# Patient Record
Sex: Female | Born: 1975 | Race: White | Hispanic: Yes | Marital: Single | Smoking: Never smoker
Health system: Southern US, Community
[De-identification: ages and names within clinical notes are randomized; demographics above are authoritative.]

---

## 1998-08-19 ENCOUNTER — Ambulatory Visit (HOSPITAL_COMMUNITY): Admission: RE | Admit: 1998-08-19 | Discharge: 1998-08-19 | Payer: Self-pay | Admitting: Family Medicine

## 1998-08-19 ENCOUNTER — Emergency Department (HOSPITAL_COMMUNITY): Admission: EM | Admit: 1998-08-19 | Discharge: 1998-08-19 | Payer: Self-pay | Admitting: Emergency Medicine

## 1998-08-19 ENCOUNTER — Encounter: Payer: Self-pay | Admitting: Family Medicine

## 1998-08-20 ENCOUNTER — Encounter: Payer: Self-pay | Admitting: Emergency Medicine

## 2001-03-15 ENCOUNTER — Encounter: Admission: RE | Admit: 2001-03-15 | Discharge: 2001-03-15 | Payer: Self-pay | Admitting: Family Medicine

## 2001-03-19 ENCOUNTER — Inpatient Hospital Stay (HOSPITAL_COMMUNITY): Admission: AD | Admit: 2001-03-19 | Discharge: 2001-03-19 | Payer: Self-pay | Admitting: *Deleted

## 2001-03-19 ENCOUNTER — Encounter: Payer: Self-pay | Admitting: *Deleted

## 2001-03-22 ENCOUNTER — Encounter: Admission: RE | Admit: 2001-03-22 | Discharge: 2001-03-22 | Payer: Self-pay | Admitting: Family Medicine

## 2001-03-25 ENCOUNTER — Encounter: Payer: Self-pay | Admitting: Obstetrics

## 2001-03-25 ENCOUNTER — Inpatient Hospital Stay (HOSPITAL_COMMUNITY): Admission: AD | Admit: 2001-03-25 | Discharge: 2001-03-25 | Payer: Self-pay | Admitting: Obstetrics

## 2001-03-27 ENCOUNTER — Encounter: Admission: RE | Admit: 2001-03-27 | Discharge: 2001-03-27 | Payer: Self-pay | Admitting: Family Medicine

## 2001-04-04 ENCOUNTER — Encounter: Admission: RE | Admit: 2001-04-04 | Discharge: 2001-04-04 | Payer: Self-pay | Admitting: Family Medicine

## 2001-04-19 ENCOUNTER — Encounter: Admission: RE | Admit: 2001-04-19 | Discharge: 2001-04-19 | Payer: Self-pay | Admitting: Family Medicine

## 2001-05-02 ENCOUNTER — Encounter: Admission: RE | Admit: 2001-05-02 | Discharge: 2001-05-02 | Payer: Self-pay | Admitting: Sports Medicine

## 2001-05-02 ENCOUNTER — Encounter (INDEPENDENT_AMBULATORY_CARE_PROVIDER_SITE_OTHER): Payer: Self-pay | Admitting: Specialist

## 2001-05-02 ENCOUNTER — Inpatient Hospital Stay (HOSPITAL_COMMUNITY): Admission: AD | Admit: 2001-05-02 | Discharge: 2001-05-28 | Payer: Self-pay | Admitting: *Deleted

## 2001-05-03 ENCOUNTER — Encounter: Payer: Self-pay | Admitting: *Deleted

## 2001-05-08 ENCOUNTER — Encounter: Payer: Self-pay | Admitting: Obstetrics

## 2001-05-10 ENCOUNTER — Encounter: Admission: RE | Admit: 2001-05-10 | Discharge: 2001-05-10 | Payer: Self-pay | Admitting: Family Medicine

## 2001-05-13 ENCOUNTER — Encounter: Payer: Self-pay | Admitting: Family Medicine

## 2001-05-26 ENCOUNTER — Encounter: Payer: Self-pay | Admitting: *Deleted

## 2003-08-27 ENCOUNTER — Ambulatory Visit (HOSPITAL_COMMUNITY): Admission: RE | Admit: 2003-08-27 | Discharge: 2003-08-27 | Payer: Self-pay | Admitting: Obstetrics and Gynecology

## 2003-09-17 ENCOUNTER — Ambulatory Visit (HOSPITAL_COMMUNITY): Admission: RE | Admit: 2003-09-17 | Discharge: 2003-09-17 | Payer: Self-pay | Admitting: Family Medicine

## 2009-11-10 ENCOUNTER — Encounter: Admission: RE | Admit: 2009-11-10 | Discharge: 2009-11-10 | Payer: Self-pay | Admitting: Family Medicine

## 2010-09-19 ENCOUNTER — Encounter: Payer: Self-pay | Admitting: Family Medicine

## 2011-01-15 NOTE — Discharge Summary (Signed)
[facility]  Patient:    Makayla Guerrero, Makayla Guerrero Visit Number: [id number] MRN: [id number]          Service Type: OBS Location: 9300 [id number] Attending Physician:  Michaelle Copas Dictated by:   Dalbert Mayotte, M.D. Admit Date:  05/02/2001 Discharge Date: 05/28/2001                             Discharge Summary  DATE OF BIRTH:                Jun 18, 1976  DISCHARGE DIAGNOSES:           1. Normal spontaneous vaginal delivery of twin                                  males, both footling breech.                               2. Preterm rupture of membranes.                               3. Oligohydramnios.                               4. Positive group B strep status.  DISCHARGE MEDICATIONS:        1. Motrin 600 mg p.o. q.6h. p.r.n. pain.                               2. Depo-Provera 150 mg IM every three months.  PROCEDURES:                   1. Breech extraction of two female twins.                               2. Peripherally inserted central catheter line                                  placement.                               3. Administration of IV antibiotics postpartum                                  and prenatal.  HISTORY OF PRESENT ILLNESS:   The patient is a 35 year old G3, P2-0-0-2 who presented at 24-3/7 weeks by 23-week ultrasound, who was found to have a cervical exam of fingertip and only 0.5 cm in length.  she also was noted to be GBS positive and to be a twin gestation.  Secondary to her cervical change and GBS positive status, she was brought in for observation.  HOSPITAL COURSE:              The patient was admitted, placed on IV antibiotics and placed on continuous monitoring to evaluate for any further contractions.  The patient had an ultrasound for  AFI and was found to have an oligo around twin A.  Twin A had 3.0 Doppler for more than normal limits. Twin B had 5.0 with Dopplers 4-8, which were elevated.  The  patient was evaluated over this time, during which time the heart rate tracings were reassuring on both twin A and B.  Repeat AFI was performed.  As the pregnancy developed past 27-4/7 weeks, steroids were administered for improved fetal lung maturity.  I was decided to plan for C-section delivery with cervical change.  The patient did have some cervical change take place on the morning of September 28.  It was fingertip, 1 cm, soft and 50% to 60% effaced.  It was felt that she was probably in labor.  She was continued on close monitoring. On the morning of May 27, 2001, the patient was found in the bathroom and had had precipitous delivery of Baby A via footling breech.  At 0705, a female was delivered with Apgars of 4 at one minute and 7 at five minutes.  The patient was immediately taken back to the OR for delivery of twin B but, on arrival to the OR, had spontaneous rupture of membranes and twin B was delivered vaginally via breech delivery at [facility].  Apgars on twin B were 7 at one minute and 8 at five minutes.  Both infants were taken to the NICU.  The placenta was retained, and this was manually extracted.  There were no lacerations identified.  Postpartum, the patient remained stable.  She was continued on IV antibiotics for an additional 24 hours.  She remained afebrile.  Postpartum hemoglobin on the date of discharge was 11.1.  She was without complaints, ambulating well, tolerating p.o. well with minimal pain. Her bleeding was minimal, as well.  She did request to go home on the date of discharge.  She did receive a Depo-Provera injection prior to discharge per her request.  She was felt stable for discharge to home on May 28, 2001.  DISCHARGE INSTRUCTIONS:       She was instructed to use the Motrin 600 mg q.6h. p.r.n. pain.  Increase activity as tolerated.  Avoid heavy lifting for the next two weeks.  She was instructed to follow up at [facility] in six weeks.  She  was instructed that, if she should develop a fever or worsening pain that is not relieved with the Motrin, she should call [facility] or immediately return to the maternity admissions unit.Dictated by:   Dalbert Mayotte, M.D. Attending Physician:  Michaelle Copas DD:  05/28/01 TD:  05/28/01 Job: [phone] [id number]

## 2011-01-15 NOTE — Op Note (Signed)
[facility]  Patient:    Makayla Guerrero, Makayla Guerrero Visit Number: [id number] MRN: [id number]          Service Type: OBS Location: 910D 9125 01 Attending Physician:  Michaelle Copas Dictated by:   Bing Neighbors Clearance Coots, M.D. Proc. Date: 05/27/01 Admit Date:  05/02/2001                             Operative Report  OBSTETRICIAN:                 Charles A. Clearance Coots, M.D.  DELIVERY NOTE:  I was called to the patients room for delivery of infant that had precipitously delivered in the bathroom when the patient got up to go to the bathroom while waiting to be transported to the operating room for cesarean section delivery. Baby A was a normal spontaneous delivery precipitously from breech, a viable female at 66.  Apgars were 4 at one minute and 7 at five minutes.  Weight was 1163 g.  Cord pH was 7.18.  On examination after delivery of baby A, the membranes were bulging and the cervix was fully dilated.  The decision was made to transport the patient to the operating room for further examination for the presenting part and delivery plan for twin B. The cervix was fully dilated.  Upon arrival in the operating room, the patients membranes ruptured spontaneously on the operating table and the breech presented at 0 station in the vagina.  The decision was made to proceed with vaginal delivery.  Baby B was a normal spontaneous vaginal delivery from frank breech of a viable female at 58.  Apgars were 7 at one minute and 8 at five minutes.  Weight was 1200 g.  Cord pH was 7.27.  The placenta was retained and therefore manually extracted intact.  The cervix and vagina were inspected for lacerations and there were no lacerations noted.  Estimated blood loss was 400 ml.  The patient tolerated the procedure well.  Both infants were taken to the neonatal intensive care unit because of prematurity. ictated by:   Bing Neighbors Clearance Coots, M.D. Attending Physician:  Michaelle Copas DD:  05/27/01 TD:  05/27/01 Job: [phone] [id number]

## 2014-06-10 ENCOUNTER — Encounter (HOSPITAL_BASED_OUTPATIENT_CLINIC_OR_DEPARTMENT_OTHER): Payer: Self-pay | Admitting: Emergency Medicine

## 2014-06-10 ENCOUNTER — Emergency Department (HOSPITAL_BASED_OUTPATIENT_CLINIC_OR_DEPARTMENT_OTHER): Payer: Worker's Compensation

## 2014-06-10 ENCOUNTER — Emergency Department (HOSPITAL_BASED_OUTPATIENT_CLINIC_OR_DEPARTMENT_OTHER)
Admission: EM | Admit: 2014-06-10 | Discharge: 2014-06-10 | Disposition: A | Discharge status: Home / Self Care | Payer: Worker's Compensation | Attending: Emergency Medicine | Admitting: Emergency Medicine

## 2014-06-10 DIAGNOSIS — S62639B Displaced fracture of distal phalanx of unspecified finger, initial encounter for open fracture: Secondary | ICD-10-CM

## 2014-06-10 DIAGNOSIS — Y990 Civilian activity done for income or pay: Secondary | ICD-10-CM | POA: Diagnosis not present

## 2014-06-10 DIAGNOSIS — Y9389 Activity, other specified: Secondary | ICD-10-CM | POA: Diagnosis not present

## 2014-06-10 DIAGNOSIS — W230XXA Caught, crushed, jammed, or pinched between moving objects, initial encounter: Secondary | ICD-10-CM | POA: Diagnosis not present

## 2014-06-10 DIAGNOSIS — Z23 Encounter for immunization: Secondary | ICD-10-CM | POA: Diagnosis not present

## 2014-06-10 DIAGNOSIS — S62630B Displaced fracture of distal phalanx of right index finger, initial encounter for open fracture: Secondary | ICD-10-CM | POA: Insufficient documentation

## 2014-06-10 DIAGNOSIS — S68129A Partial traumatic metacarpophalangeal amputation of unspecified finger, initial encounter: Secondary | ICD-10-CM

## 2014-06-10 DIAGNOSIS — Y9289 Other specified places as the place of occurrence of the external cause: Secondary | ICD-10-CM | POA: Diagnosis not present

## 2014-06-10 DIAGNOSIS — S6991XA Unspecified injury of right wrist, hand and finger(s), initial encounter: Secondary | ICD-10-CM | POA: Diagnosis present

## 2014-06-10 DIAGNOSIS — S68119A Complete traumatic metacarpophalangeal amputation of unspecified finger, initial encounter: Secondary | ICD-10-CM

## 2014-06-10 MED ORDER — CEPHALEXIN 500 MG PO CAPS: 500.0000 mg | ORAL_CAPSULE | Freq: Four times a day (QID) | ORAL | Status: DC

## 2014-06-10 MED ORDER — MORPHINE SULFATE 4 MG/ML IJ SOLN
4.0000 mg | Freq: Once | INTRAMUSCULAR | Status: AC
  Administered 2014-06-10: 4 mg via INTRAVENOUS
  Filled 2014-06-10: qty 1

## 2014-06-10 MED ORDER — OXYCODONE-ACETAMINOPHEN 5-325 MG PO TABS: 1.0000 | ORAL_TABLET | ORAL | Status: DC | PRN

## 2014-06-10 MED ORDER — ONDANSETRON HCL 4 MG/2ML IJ SOLN
4.0000 mg | Freq: Once | INTRAMUSCULAR | Status: AC
  Administered 2014-06-10: 4 mg via INTRAVENOUS
  Filled 2014-06-10: qty 2

## 2014-06-10 MED ORDER — SODIUM CHLORIDE 0.9 % IV SOLN
Freq: Once | INTRAVENOUS | Status: AC
  Administered 2014-06-10: 16:00:00 via INTRAVENOUS

## 2014-06-10 MED ORDER — CEFAZOLIN SODIUM 1-5 GM-% IV SOLN
1.0000 g | Freq: Once | INTRAVENOUS | Status: AC
  Administered 2014-06-10: 1 g via INTRAVENOUS
  Filled 2014-06-10: qty 50

## 2014-06-10 MED ORDER — TETANUS-DIPHTH-ACELL PERTUSSIS 5-2.5-18.5 LF-MCG/0.5 IM SUSP
0.5000 mL | Freq: Once | INTRAMUSCULAR | Status: AC
  Administered 2014-06-10: 0.5 mL via INTRAMUSCULAR
  Filled 2014-06-10: qty 0.5

## 2014-06-10 MED ORDER — TETANUS-DIPHTHERIA TOXOIDS TD 5-2 LFU IM INJ: 0.5000 mL | INJECTION | Freq: Once | INTRAMUSCULAR | Status: DC

## 2014-06-10 MED ORDER — OXYCODONE-ACETAMINOPHEN 5-325 MG PO TABS
ORAL_TABLET | ORAL | Status: AC
  Administered 2014-06-10: 2
  Filled 2014-06-10: qty 2

## 2014-06-10 NOTE — Consult Note (Signed)
ORTHOPAEDIC CONSULTATION HISTORY & PHYSICAL REQUESTING PHYSICIAN: Charlesetta Shanks, MD  Chief Complaint: Right ring finger injury  HPI: Makayla Guerrero is a 38 y.o. female who sustained a terminal amputation of the right ring finger today. This is reported to be on the job. Amputated portions of the finger and present. Apparently some type of machine fell upon her ring fingertip, pinching it off. She has had her tetanus updated. Her finger has a dressing on it.  History reviewed. No pertinent past medical history. History reviewed. No pertinent past surgical history. History   Social History  . Marital Status: Married    Spouse Name: N/A    Number of Children: N/A  . Years of Education: N/A   Social History Main Topics  . Smoking status: Never Smoker   . Smokeless tobacco: None  . Alcohol Use: No  . Drug Use: No  . Sexual Activity: None   Other Topics Concern  . None   Social History Narrative  . None   No family history on file. No Known Allergies Prior to Admission medications   Medication Sig Start Date End Date Taking? Authorizing Provider  cephALEXin (KEFLEX) 500 MG capsule Take 1 capsule (500 mg total) by mouth 4 (four) times daily. 06/10/14   Jolyn Nap, MD  oxyCODONE-acetaminophen (ROXICET) 5-325 MG per tablet Take 1-2 tablets by mouth every 4 (four) hours as needed for severe pain. 06/10/14   Jolyn Nap, MD   Dg Hand Complete Right  06/10/2014   CLINICAL DATA:  38 year old female status post crush injury at work to the right ring finger (not otherwise specified at the time of this report).  EXAM: RIGHT HAND - COMPLETE 3+ VIEW  COMPARISON:  None.  FINDINGS: Traumatic amputation of a portion of the distal soft tissues at the right index finger. Underlying comminuted fracture of the tuft, with displaced fragments, including approximating the skin surface. The distal aspect of the residual fourth proximal phalanx also approximates the skin surface. The  fourth DIP appears intact. Other osseous structures appear intact.  IMPRESSION: Posttraumatic open, comminuted fracture of the tuft of the right fourth distal phalanx with some overlying soft tissue amputation.   Electronically Signed   By: Lars Pinks M.D.   On: 06/10/2014 14:55    Positive ROS: All other systems have been reviewed and were otherwise negative with the exception of those mentioned in the HPI and as above.  Physical Exam: Vitals: Refer to EMR. Constitutional:  WD, WN, NAD HEENT:  NCAT, EOMI Neuro/Psych:  Alert & oriented to person, place, and time; appropriate mood & affect Lymphatic: No generalized extremity edema or lymphadenopathy Extremities / MSK:  The extremities are normal with respect to appearance, ranges of motion, joint stability, muscle strength/tone, sensation, & perfusion except as otherwise noted:  Right ring fingertip is missing. The amputation is slightly oblique, shorter on the ulnar side longer on radial side. All of the terminal matrix is intact, and a portion of the base of the sterile matrix is intact, although amputated obliquely. The nail bed there remains does have good distal phalangeal support. There are a couple of other smaller pieces of bone within the soft tissues, and these were excised. There is a non-volar soft tissue to repair to the distal edge of the nail bed with suture.  Assessment: Right ring fingertip amputation through the distal phalanx  Plan: Digital block was instilled by me with plain lidocaine. Tourniquet was applied the base of the digit and the  digit wound was copiously irrigated under running water at the sink. Further inspected it under loupe magnification with bright lights, with findings as described above. I conversed with the patient, translated via bilingual people within the room, and the patient wished to try to preserve all length as possible at this time. Consequently, the small free fragments of bone were excised. The wound  edges, particularly volar radially was debrided sharply, with excisional debridement of skin and subcutaneous tissues. The subcutaneous soft tissues of the volar pad that remained were then repaired to cover the end of the distal phalanx that remained, and this was done with 5-0 Vicryl Rapide interrupted sutures. A dressing was applied with Xeroform, gauze, and ultimately Coban.  She was given prescriptions for pain medicines, antibiotics, and discharge/followup instructions. RTC next week Monday or Tuesday for reevaluation of the wound.  Rayvon Char Grandville Silos, [address] [place], [place]  [address] Office: [phone] Mobile: [phone]

## 2014-06-10 NOTE — ED Notes (Signed)
Mash injury to her right 4th digit at work today. Bleeding controlled.

## 2014-06-10 NOTE — Discharge Instructions (Signed)
Keep your bandage on, clean and dry.     WORK STATUS:  No work until re-eval appointment next week

## 2014-06-10 NOTE — ED Provider Notes (Signed)
CSN: [id number]     Arrival date & time 06/10/14  1412 History   First MD Initiated Contact with Patient 06/10/14 1505     Chief Complaint  Patient presents with  . Hand Injury     (Consider location/radiation/quality/duration/timing/severity/associated sxs/prior Treatment) HPI Patient caught her right fourth digit in a machine at work. The tip was crushed. She reports is very painful. There is no other associated injury.  History reviewed. No pertinent past medical history. History reviewed. No pertinent past surgical history. No family history on file. History  Substance Use Topics  . Smoking status: Never Smoker   . Smokeless tobacco: Not on file  . Alcohol Use: No   OB History   Grav Para Term Preterm Abortions TAB SAB Ect Mult Living                 Review of Systems  Constitutional: No recent fever chills or other general illness.  Allergies  Review of patient's allergies indicates no known allergies.  Home Medications   Prior to Admission medications   Medication Sig Start Date End Date Taking? Authorizing Provider  cephALEXin (KEFLEX) 500 MG capsule Take 1 capsule (500 mg total) by mouth 4 (four) times daily. 06/10/14   Jolyn Nap, MD  oxyCODONE-acetaminophen (ROXICET) 5-325 MG per tablet Take 1-2 tablets by mouth every 4 (four) hours as needed for severe pain. 06/10/14   Jolyn Nap, MD   BP 125/80  Pulse 77  Temp(Src) 98.4 F (36.9 C) (Oral)  Resp 16  Ht 5\' 6"  (1.676 m)  Wt 186 lb (84.369 kg)  BMI 30.04 kg/m2  SpO2 97%  LMP 05/27/2014 Physical Exam  Constitutional: She is oriented to person, place, and time. She appears well-developed and well-nourished.  The patient is in pain, but alert appropriate and interactive.  HENT:  Head: Normocephalic and atraumatic.  Eyes: EOM are normal.  Cardiovascular: Normal rate, regular rhythm and normal heart sounds.   Pulmonary/Chest: Effort normal and breath sounds normal.  Musculoskeletal:  The  right fourth digit has a crush avulsion to the distal portion. The nail is completely of avulsed and unseated, there is no apparent eponychial fold for reinsertion. Most of the distal pad is avulsed as well as the tip, the bone is centrally visible.  Neurological: She is alert and oriented to person, place, and time. Coordination normal.  Skin: Skin is warm and dry.  Psychiatric: She has a normal mood and affect.     ED Course  Procedures (including critical care time) Labs Review Labs Reviewed - No data to display  Imaging Review Dg Hand Complete Right  06/10/2014   CLINICAL DATA:  38 year old female status post crush injury at work to the right ring finger (not otherwise specified at the time of this report).  EXAM: RIGHT HAND - COMPLETE 3+ VIEW  COMPARISON:  None.  FINDINGS: Traumatic amputation of a portion of the distal soft tissues at the right index finger. Underlying comminuted fracture of the tuft, with displaced fragments, including approximating the skin surface. The distal aspect of the residual fourth proximal phalanx also approximates the skin surface. The fourth DIP appears intact. Other osseous structures appear intact.  IMPRESSION: Posttraumatic open, comminuted fracture of the tuft of the right fourth distal phalanx with some overlying soft tissue amputation.   Electronically Signed   By: Lars Pinks M.D.   On: 06/10/2014 14:55     EKG Interpretation None     Consult:16:05. Hand specialist, Dr. Grandville Silos,  will see in ED. MDM   Final diagnoses:  Amputation of finger tip, initial encounter  Open fracture of tuft of distal phalanx of finger, initial encounter   Complex repair has been done by Dr. Grandville Silos in the emergency department. A followup plan is in place.    Charlesetta Shanks, MD 06/10/14 [id number]

## 2014-06-10 NOTE — ED Notes (Signed)
Pfeiffer MD at bedside 

## 2018-12-23 ENCOUNTER — Other Ambulatory Visit: Payer: Self-pay

## 2018-12-23 ENCOUNTER — Encounter (HOSPITAL_COMMUNITY): Payer: Self-pay | Admitting: Emergency Medicine

## 2018-12-23 ENCOUNTER — Emergency Department (HOSPITAL_COMMUNITY): Payer: Commercial Managed Care - PPO

## 2018-12-23 ENCOUNTER — Emergency Department (HOSPITAL_COMMUNITY)
Admission: EM | Admit: 2018-12-23 | Discharge: 2018-12-23 | Disposition: A | Discharge status: Home / Self Care | Payer: Commercial Managed Care - PPO | Attending: Emergency Medicine | Admitting: Emergency Medicine

## 2018-12-23 DIAGNOSIS — S63633A Sprain of interphalangeal joint of left middle finger, initial encounter: Secondary | ICD-10-CM | POA: Diagnosis not present

## 2018-12-23 DIAGNOSIS — Y990 Civilian activity done for income or pay: Secondary | ICD-10-CM | POA: Insufficient documentation

## 2018-12-23 DIAGNOSIS — Y9389 Activity, other specified: Secondary | ICD-10-CM | POA: Insufficient documentation

## 2018-12-23 DIAGNOSIS — Y9289 Other specified places as the place of occurrence of the external cause: Secondary | ICD-10-CM | POA: Diagnosis not present

## 2018-12-23 DIAGNOSIS — X500XXA Overexertion from strenuous movement or load, initial encounter: Secondary | ICD-10-CM | POA: Insufficient documentation

## 2018-12-23 DIAGNOSIS — S6991XA Unspecified injury of right wrist, hand and finger(s), initial encounter: Secondary | ICD-10-CM | POA: Diagnosis present

## 2018-12-23 MED ORDER — MELOXICAM 15 MG PO TABS: 15.0000 mg | ORAL_TABLET | Freq: Every day | ORAL | 0 refills | Status: AC

## 2018-12-23 NOTE — Discharge Instructions (Addendum)
Get help right away if: Your pain is not controlled with medicine. Your bruising or swelling gets worse. Your splint or cast is damaged. Your finger is numb or blue. Your finger feels colder to the touch than normal. You develop a fever.

## 2018-12-23 NOTE — ED Triage Notes (Signed)
Pt hurt her finger yesterday at work. Pt left middle finger is noticeably swollen and red. Pt has taken tylenol for the pain.

## 2018-12-23 NOTE — ED Notes (Signed)
Patient verbalizes understanding of discharge instructions. Opportunity for questioning and answers were provided. Armband removed by staff, pt discharged from ED ambulatory to home.  

## 2018-12-23 NOTE — ED Provider Notes (Signed)
[facility] EMERGENCY DEPARTMENT Provider Note   CSN: [id number] Arrival date & time: 12/23/18  1512    History   Chief Complaint No chief complaint on file.   HPI Makayla Guerrero is a 43 y.o. female.     The history is limited by a language barrier. A language interpreter was used.  Hand Pain  This is a new problem. The current episode started yesterday. The problem has not changed since onset.Pertinent negatives include no chest pain, no abdominal pain, no headaches and no shortness of breath. The symptoms are aggravated by bending. Nothing relieves the symptoms.   Makayla Guerrero is a 43 y.o. female who sustained a left middle finger injury 1 day(s) ago. Mechanism of injury: Patient was at work and had a rapid extension of the left middle digit. Immediate symptoms: immediate pain, immediate swelling, no deformity was noted by the patient. Symptoms have been constant since that time. Prior history of related problems: no prior problems with this area in the past. Pain is worst at the PIP joint., She has bruising and swelling. Pain is throbbing and constant. Worsened with movement at the PIP. + associated paresthesia. Noweakness  .     History reviewed. No pertinent past medical history.  There are no active problems to display for this patient.   History reviewed. No pertinent surgical history.   OB History   No obstetric history on file.      Home Medications    Prior to Admission medications   Medication Sig Start Date End Date Taking? Authorizing Provider  acetaminophen (TYLENOL) 500 MG tablet Take 1,000 mg by mouth every 6 (six) hours as needed for mild pain or fever.   Yes [provider]  cephALEXin (KEFLEX) 500 MG capsule Take 1 capsule (500 mg total) by mouth 4 (four) times daily. Patient not taking: Reported on 12/23/2018 06/10/14   Milly Jakob, MD  meloxicam (MOBIC) 15 MG tablet Take 1 tablet (15 mg total) by mouth  daily. Take 1 daily with food. 12/23/18   Margarita Mail, PA-C  oxyCODONE-acetaminophen (ROXICET) 5-325 MG per tablet Take 1-2 tablets by mouth every 4 (four) hours as needed for severe pain. Patient not taking: Reported on 12/23/2018 06/10/14   Milly Jakob, MD    Family History No family history on file.  Social History Social History   Tobacco Use  . Smoking status: Never Smoker  Substance Use Topics  . Alcohol use: No  . Drug use: No     Allergies   Patient has no known allergies.   Review of Systems Review of Systems  Respiratory: Negative for shortness of breath.   Cardiovascular: Negative for chest pain.  Gastrointestinal: Negative for abdominal pain.  Neurological: Negative for headaches.     Physical Exam Updated Vital Signs BP 119/83   Pulse (!) 58   Temp 98.5 F (36.9 C) (Oral)   Resp 16   SpO2 99%   Physical Exam Vitals signs and nursing note reviewed.  Constitutional:      General: She is not in acute distress.    Appearance: She is well-developed. She is not diaphoretic.  HENT:     Head: Normocephalic and atraumatic.  Eyes:     General: No scleral icterus.    Conjunctiva/sclera: Conjunctivae normal.  Neck:     Musculoskeletal: Normal range of motion.  Cardiovascular:     Rate and Rhythm: Normal rate and regular rhythm.     Heart sounds: Normal heart  sounds. No murmur. No friction rub. No gallop.   Pulmonary:     Effort: Pulmonary effort is normal. No respiratory distress.     Breath sounds: Normal breath sounds.  Abdominal:     General: Bowel sounds are normal. There is no distension.     Palpations: Abdomen is soft. There is no mass.     Tenderness: There is no abdominal tenderness. There is no guarding.  Musculoskeletal:     Comments: Hand exam: soft tissue tenderness and swelling at the PIP and along the collateral ligament on the radial side of the finger, reduced range of motion of at the PIP with swellng and ecchymosis ROM/  strength WNL at DIP/MCP, radial pulse normal, remainder of ipsilateral wrist, hand and finger exam is normal.   Skin:    General: Skin is warm and dry.  Neurological:     Mental Status: She is alert and oriented to person, place, and time.  Psychiatric:        Behavior: Behavior normal.      ED Treatments / Results  Labs (all labs ordered are listed, but only abnormal results are displayed) Labs Reviewed - No data to display  EKG None  Radiology Dg Finger Middle Left  Result Date: 12/23/2018 CLINICAL DATA:  Finger injury swelling and redness EXAM: LEFT MIDDLE FINGER 2+V COMPARISON:  None. FINDINGS: There is no evidence of fracture or dislocation. There is no evidence of arthropathy or other focal bone abnormality. Soft tissue swelling is present IMPRESSION: No acute osseous abnormality Electronically Signed   By: Donavan Foil M.D.   On: 12/23/2018 17:58    Procedures Procedures (including critical care time)  Medications Ordered in ED Medications - No data to display   Initial Impression / Assessment and Plan / ED Course  I have reviewed the triage vital signs and the nursing notes.  Pertinent labs & imaging results that were available during my care of the patient were reviewed by me and considered in my medical decision making (see chart for details).       Patient X-Ray negative for obvious fracture or dislocation.  Personally reviewed the left middle finger x-ray.  Agree with radiologic interpretation. . Pt advised to follow up with orthopedics if symptoms persist for possibility of missed fracture diagnosis. Patient given brace while in ED, conservative therapy recommended and discussed. Patient will be dc home & is agreeable with above plan.   Final Clinical Impressions(s) / ED Diagnoses   Final diagnoses:  Sprain of interphalangeal joint of left middle finger, initial encounter    ED Discharge Orders         Ordered    meloxicam (MOBIC) 15 MG tablet  Daily      12/23/18 1815           Margarita Mail, PA-C 12/23/18 1817    Daleen Bo, MD 12/25/18 [phone]

## 2019-01-24 ENCOUNTER — Other Ambulatory Visit: Payer: Self-pay

## 2019-01-24 ENCOUNTER — Ambulatory Visit
Admission: EM | Admit: 2019-01-24 | Discharge: 2019-01-24 | Disposition: A | Discharge status: Home / Self Care | Payer: Commercial Managed Care - PPO | Attending: Family Medicine | Admitting: Family Medicine

## 2019-01-24 DIAGNOSIS — Z202 Contact with and (suspected) exposure to infections with a predominantly sexual mode of transmission: Secondary | ICD-10-CM | POA: Diagnosis present

## 2019-01-24 MED ORDER — LIDOCAINE HCL (PF) 1 % IJ SOLN
2.0000 mL | Freq: Once | INTRAMUSCULAR | Status: AC
  Administered 2019-01-24: 2 mL

## 2019-01-24 MED ORDER — CEFTRIAXONE SODIUM 250 MG IJ SOLR
250.0000 mg | Freq: Once | INTRAMUSCULAR | Status: AC
  Administered 2019-01-24: 250 mg via INTRAMUSCULAR

## 2019-01-24 MED ORDER — AZITHROMYCIN 500 MG PO TABS
1000.0000 mg | ORAL_TABLET | Freq: Once | ORAL | Status: AC
  Administered 2019-01-24: 1000 mg via ORAL

## 2019-01-24 NOTE — ED Provider Notes (Signed)
[name]    CSN: [id number] Arrival date & time: 01/24/19  1450     History   Chief Complaint Chief Complaint  Patient presents with  . SEXUALLY TRANSMITTED DISEASE    HPI Makayla Guerrero is a 43 y.o. female.   Pt is a 43 year old female that presents for STD screening. She was told by partner of year and half that he was positive for STD. She is unsure of which STD. He did not tell her. She has been having unprotected sex with him. She denies any dysuria, hematuria, urinary frequency, vaginal discharge, itching or irritation.  Denies any rashes.  Denies any fever, abdominal pain, back pain or pelvic pain.  ROS per HPI  All information was obtained using the Spanish interpreter      History reviewed. No pertinent past medical history.  There are no active problems to display for this patient.   History reviewed. No pertinent surgical history.  OB History   No obstetric history on file.      Home Medications    Prior to Admission medications   Medication Sig Start Date End Date Taking? Authorizing Provider  acetaminophen (TYLENOL) 500 MG tablet Take 1,000 mg by mouth every 6 (six) hours as needed for mild pain or fever.    [provider]  meloxicam (MOBIC) 15 MG tablet Take 1 tablet (15 mg total) by mouth daily. Take 1 daily with food. 12/23/18   Margarita Mail, PA-C    Family History No family history on file.  Social History Social History   Tobacco Use  . Smoking status: Never Smoker  . Smokeless tobacco: Never Used  Substance Use Topics  . Alcohol use: No  . Drug use: No     Allergies   Patient has no known allergies.   Review of Systems Review of Systems   Physical Exam Triage Vital Signs ED Triage Vitals  Enc Vitals Group     BP 01/24/19 1503 111/68     Pulse Rate 01/24/19 1503 77     Resp 01/24/19 1503 18     Temp 01/24/19 1503 98.1 F (36.7 C)     Temp Source 01/24/19 1503 Oral     SpO2 01/24/19  1503 96 %     Weight --      Height --      Head Circumference --      Peak Flow --      Pain Score 01/24/19 1504 0     Pain Loc --      Pain Edu? --      Excl. in [place]? --    No data found.  Updated Vital Signs BP 111/68 (BP Location: Left Arm)   Pulse 77   Temp 98.1 F (36.7 C) (Oral)   Resp 18   LMP 01/17/2019   SpO2 96%   Visual Acuity Right Eye Distance:   Left Eye Distance:   Bilateral Distance:    Right Eye Near:   Left Eye Near:    Bilateral Near:     Physical Exam Vitals signs and nursing note reviewed.  Constitutional:      General: She is not in acute distress.    Appearance: Normal appearance. She is not ill-appearing, toxic-appearing or diaphoretic.  HENT:     Head: Normocephalic.     Nose: Nose normal.     Mouth/Throat:     Pharynx: Oropharynx is clear.  Eyes:     Conjunctiva/sclera: Conjunctivae normal.  Neck:     Musculoskeletal: Normal range of motion.  Pulmonary:     Effort: Pulmonary effort is normal.  Abdominal:     Palpations: Abdomen is soft.     Tenderness: There is no abdominal tenderness.  Musculoskeletal: Normal range of motion.  Skin:    General: Skin is warm and dry.     Findings: No rash.  Neurological:     Mental Status: She is alert.  Psychiatric:        Mood and Affect: Mood normal.      UC Treatments / Results  Labs (all labs ordered are listed, but only abnormal results are displayed) Labs Reviewed  HIV ANTIBODY (ROUTINE TESTING W REFLEX)  RPR  CERVICOVAGINAL ANCILLARY ONLY    EKG None  Radiology No results found.  Procedures Procedures (including critical care time)  Medications Ordered in UC Medications  cefTRIAXone (ROCEPHIN) injection 250 mg (250 mg Intramuscular Given 01/24/19 1530)  azithromycin (ZITHROMAX) tablet 1,000 mg (1,000 mg Oral Given 01/24/19 1530)  lidocaine (PF) (XYLOCAINE) 1 % injection 2 mL (2 mLs Other Given 01/24/19 1549)    Initial Impression / Assessment and Plan / UC Course  I  have reviewed the triage vital signs and the nursing notes.  Pertinent labs & imaging results that were available during my care of the patient were reviewed by me and considered in my medical decision making (see chart for details).    STD screening   Testing for STDs,  Treating prophylactically for gonorrhea and chlamydia. Labs pending  Final Clinical Impressions(s) / UC Diagnoses   Final diagnoses:  Exposure to STD     Discharge Instructions     We are treating you prophylactically today for gonorrhea and chlamydia We are sending your swab for testing and will call you with any positive results We are also testing you for HIV and syphilis We should have all of these results in 3 to 4 days.     ED Prescriptions    None     Controlled Substance Prescriptions [place] Controlled Substance Registry consulted? Not Applicable   Orvan July, NP 01/24/19 1711

## 2019-01-24 NOTE — ED Triage Notes (Signed)
Pt states through translator, "she had un protective intercourse with her partner for the past year and he states he has an illness, not sure if he got it from her or someone else. She wants to be checked for STD". Pt denies any symptoms

## 2019-01-24 NOTE — Discharge Instructions (Addendum)
We are treating you prophylactically today for gonorrhea and chlamydia We are sending your swab for testing and will call you with any positive results We are also testing you for HIV and syphilis We should have all of these results in 3 to 4 days.

## 2019-01-25 LAB — RPR: RPR Ser Ql: NONREACTIVE

## 2019-01-25 LAB — HIV ANTIBODY (ROUTINE TESTING W REFLEX): HIV Screen 4th Generation wRfx: NONREACTIVE

## 2019-01-26 LAB — CERVICOVAGINAL ANCILLARY ONLY
Chlamydia: NEGATIVE
Neisseria Gonorrhea: NEGATIVE
Trichomonas: NEGATIVE

## 2020-10-15 ENCOUNTER — Encounter: Payer: Self-pay | Admitting: Gastroenterology

## 2020-11-24 ENCOUNTER — Other Ambulatory Visit: Payer: Self-pay

## 2020-11-24 ENCOUNTER — Ambulatory Visit (AMBULATORY_SURGERY_CENTER): Payer: Self-pay | Admitting: *Deleted

## 2020-11-24 VITALS — Ht 66.0 in | Wt 188.0 lb

## 2020-11-24 DIAGNOSIS — Z1211 Encounter for screening for malignant neoplasm of colon: Secondary | ICD-10-CM

## 2020-11-24 MED ORDER — SUTAB 1479-225-188 MG PO TABS: 1.0000 | ORAL_TABLET | Freq: Once | ORAL | 0 refills | Status: AC

## 2020-11-24 NOTE — Progress Notes (Signed)

## 2020-11-24 NOTE — Progress Notes (Signed)
Interpreter used today at the [facility] for this pt.  Interpreter's name is- Derald Macleod

## 2020-12-03 ENCOUNTER — Encounter: Payer: Self-pay | Admitting: Gastroenterology

## 2020-12-08 ENCOUNTER — Other Ambulatory Visit: Payer: Self-pay

## 2020-12-08 ENCOUNTER — Ambulatory Visit (AMBULATORY_SURGERY_CENTER): Payer: Commercial Managed Care - PPO | Admitting: Gastroenterology

## 2020-12-08 ENCOUNTER — Encounter: Payer: Self-pay | Admitting: Gastroenterology

## 2020-12-08 VITALS — BP 98/61 | HR 56 | Temp 97.1°F | Resp 11 | Ht 66.0 in | Wt 188.0 lb

## 2020-12-08 DIAGNOSIS — K635 Polyp of colon: Secondary | ICD-10-CM | POA: Diagnosis not present

## 2020-12-08 DIAGNOSIS — D125 Benign neoplasm of sigmoid colon: Secondary | ICD-10-CM

## 2020-12-08 DIAGNOSIS — Z1211 Encounter for screening for malignant neoplasm of colon: Secondary | ICD-10-CM | POA: Diagnosis present

## 2020-12-08 DIAGNOSIS — D120 Benign neoplasm of cecum: Secondary | ICD-10-CM | POA: Diagnosis not present

## 2020-12-08 MED ORDER — SODIUM CHLORIDE 0.9 % IV SOLN
500.0000 mL | Freq: Once | INTRAVENOUS | Status: DC
Start: 2020-12-08 — End: 2020-12-08

## 2020-12-08 NOTE — Progress Notes (Signed)
Pt's states no medical or surgical changes since previsit or office visit. 

## 2020-12-08 NOTE — Progress Notes (Signed)
VS taken by C.W. 

## 2020-12-08 NOTE — Op Note (Signed)
[facility] Patient Name: Makayla Guerrero Procedure Date: 12/08/2020 8:14 AM MRN: [id number] Endoscopist: Gerrit Heck , MD Age: 45 Referring MD:  Date of Birth: [birth year] Gender: Female Account #: [id number] Procedure:                Colonoscopy Indications:              Screening for colorectal malignant neoplasm, This                            is the patient's first colonoscopy Medicines:                Monitored Anesthesia Care Procedure:                Pre-Anesthesia Assessment:                           - Prior to the procedure, a History and Physical                            was performed, and patient medications and                            allergies were reviewed. The patient's tolerance of                            previous anesthesia was also reviewed. The risks                            and benefits of the procedure and the sedation                            options and risks were discussed with the patient.                            All questions were answered, and informed consent                            was obtained. Prior Anticoagulants: The patient has                            taken no previous anticoagulant or antiplatelet                            agents. ASA Grade Assessment: II - A patient with                            mild systemic disease. After reviewing the risks                            and benefits, the patient was deemed in                            satisfactory condition to undergo the procedure.  After obtaining informed consent, the colonoscope                            was passed under direct vision. Throughout the                            procedure, the patient's blood pressure, pulse, and                            oxygen saturations were monitored continuously. The                            Colonoscope was introduced through the anus and                            advanced to the the  terminal ileum. The Olympus                            CF-HQ190L (Serial# [id number]) Colonoscope was introduced                            through the and advanced to the. The colonoscopy                            was performed without difficulty. The patient                            tolerated the procedure well. The quality of the                            bowel preparation was good. The terminal ileum,                            ileocecal valve, appendiceal orifice, and rectum                            were photographed. Scope In: 10:25:51 AM Scope Out: 10:42:12 AM Scope Withdrawal Time: 0 hours 12 minutes 30 seconds  Total Procedure Duration: 0 hours 16 minutes 21 seconds  Findings:                 The perianal and digital rectal examinations were                            normal.                           A 4 mm polyp was found in the cecum. The polyp was                            sessile. The polyp was removed with a cold snare.                            Resection and retrieval were complete. Estimated  blood loss was minimal.                           Two sessile polyps were found in the sigmoid colon.                            The polyps were 2 to 3 mm in size. These polyps                            were removed with a cold snare. Resection and                            retrieval were complete. Estimated blood loss was                            minimal.                           The exam was otherwise normal throughout the                            remainder of the colon.                           The retroflexed view of the distal rectum and anal                            verge was normal and showed no anal or rectal                            abnormalities.                           The terminal ileum appeared normal. Complications:            No immediate complications. Estimated Blood Loss:     Estimated blood loss was minimal. Impression:                - One 4 mm polyp in the cecum, removed with a cold                            snare. Resected and retrieved.                           - Two 2 to 3 mm polyps in the sigmoid colon,                            removed with a cold snare. Resected and retrieved.                           - The distal rectum and anal verge are normal on                            retroflexion view.                           -  The examined portion of the ileum was normal. Recommendation:           - Patient has a contact number available for                            emergencies. The signs and symptoms of potential                            delayed complications were discussed with the                            patient. Return to normal activities tomorrow.                            Written discharge instructions were provided to the                            patient.                           - Resume previous diet.                           - Continue present medications.                           - Await pathology results.                           - Repeat colonoscopy for surveillance based on                            pathology results.                           - Return to GI office PRN. Gerrit Heck, MD 12/08/2020 10:45:38 AM

## 2020-12-08 NOTE — Progress Notes (Signed)
Report given to PACU, vss 

## 2020-12-08 NOTE — Progress Notes (Signed)
Interpreter used today at the [facility] for this pt.  Interpreter's name is-Grecia Nanden.

## 2020-12-08 NOTE — Patient Instructions (Signed)
Please read handouts provided. Continue present medications. Await pathology results. Return to GI office as needed.   USTED TUVO UN PROCEDIMIENTO ENDOSCPICO HOY EN EL [facility]:   Lea el informe del procedimiento que se le entreg para cualquier pregunta especfica sobre lo que se Primary school teacher.  Si el informe del examen no responde a sus preguntas, por favor llame a su gastroenterlogo para aclararlo.  Si usted solicit que no se le den [employer] de lo que se Estate manager/land agent en su procedimiento al [employer] va a cuidar, entonces el informe del procedimiento se ha incluido en un sobre sellado para que usted lo revise despus cuando le sea ms conveniente.   LO QUE PUEDE ESPERAR: Algunas sensaciones de hinchazn en el abdomen.  Puede tener ms gases de lo normal.  El caminar puede ayudarle a eliminar el aire que se le puso en el tracto gastrointestinal durante el procedimiento y reducir la hinchazn.  Si le hicieron una endoscopia inferior (como una colonoscopia o una sigmoidoscopia flexible), podra notar manchas de sangre en las heces fecales o en el papel higinico.  Si se someti a una preparacin intestinal para su procedimiento, es posible que no tenga una evacuacin intestinal normal durante [employer].   Tenga en cuenta:  Es posible que note un poco de irritacin y congestin en la nariz o algn drenaje.  Esto es debido al oxgeno [employer] durante su procedimiento.  No hay que preocuparse y esto debe desaparecer ms o Scientist, research (medical).   SNTOMAS PARA REPORTAR INMEDIATAMENTE:  Despus de una endoscopia inferior (colonoscopia o sigmoidoscopia flexible):  Cantidades excesivas de sangre en las heces fecales  Sensibilidad significativa o empeoramiento de los dolores abdominales   Hinchazn aguda del abdomen que antes no tena   Fiebre de 100F o ms       Para asuntos urgentes o de Freight forwarder, puede comunicarse con un gastroenterlogo a cualquier hora  llamando al [phone].  DIETA:  Recomendamos una comida pequea al principio, pero luego puede continuar con su dieta normal.  Tome muchos lquidos, Teacher, adult education las bebidas alcohlicas durante 24 horas.    ACTIVIDAD:  Debe planear tomarse las cosas con calma por el resto del da y no debe CONDUCIR ni usar maquinaria pesada Programmer, applications (debido a los medicamentos de sedacin utilizados durante el examen).     SEGUIMIENTO: Nuestro personal llamar al nmero que aparece en su historial al siguiente da hbil de su procedimiento para ver cmo se siente y para responder cualquier pregunta o inquietud que pueda tener con respecto a la informacin que se le dio despus del procedimiento. Si no podemos contactarle, le dejaremos un mensaje.  Sin embargo, si se siente bien y no tiene Paediatric nurse, no es necesario que nos devuelva la llamada.  Asumiremos que ha regresado a sus actividades diarias normales sin incidentes. Si se le tomaron algunas biopsias, le contactaremos por telfono o por carta en las prximas 3 semanas.  Si no ha sabido [employer] biopsias en el transcurso de 3 semanas, por favor llmenos al [phone].   FIRMAS/CONFIDENCIALIDAD: Usted y/o el acompaante que le cuide han firmado documentos que se ingresarn en su historial mdico electrnico.  Estas firmas atestiguan el hecho de que la informacin anterior      YOU HAD AN ENDOSCOPIC PROCEDURE TODAY AT [facility]:   Refer to the procedure report that was given to you for any specific questions about what was  found during the examination.  If the procedure report does not answer your questions, please call your gastroenterologist to clarify.  If you requested that your care partner not be given the details of your procedure findings, then the procedure report has been included in a sealed envelope for you to review at your convenience later.  YOU SHOULD EXPECT: Some feelings of bloating in the abdomen.  Passage of more gas than usual.  Walking can help get rid of the air that was put into your GI tract during the procedure and reduce the bloating. If you had a lower endoscopy (such as a colonoscopy or flexible sigmoidoscopy) you may notice spotting of blood in your stool or on the toilet paper. If you underwent a bowel prep for your procedure, you may not have a normal bowel movement for a few days.  Please Note:  You might notice some irritation and congestion in your nose or some drainage.  This is from the oxygen used during your procedure.  There is no need for concern and it should clear up in a day or so.  SYMPTOMS TO REPORT IMMEDIATELY:   Following lower endoscopy (colonoscopy or flexible sigmoidoscopy):  Excessive amounts of blood in the stool  Significant tenderness or worsening of abdominal pains  Swelling of the abdomen that is new, acute  Fever of 100F or higher   For urgent or emergent issues, a gastroenterologist can be reached at any hour by calling [phone]. Do not use MyChart messaging for urgent concerns.    DIET:  We do recommend a small meal at first, but then you may proceed to your regular diet.  Drink plenty of fluids but you should avoid alcoholic beverages for 24 hours.  ACTIVITY:  You should plan to take it easy for the rest of today and you should NOT DRIVE or use heavy machinery until tomorrow (because of the sedation medicines used during the test).    FOLLOW UP: Our staff will call the number listed on your records 48-72 hours following your procedure to check on you and address any questions or concerns that you may have regarding the information given to you following your procedure. If we do not reach you, we will leave a message.  We will attempt to reach you two times.  During this call, we will ask if you have developed any symptoms of COVID 19. If you develop any symptoms (ie: fever, flu-like symptoms, shortness of breath, cough etc.) before then,  please call [phone].  If you test positive for Covid 19 in the 2 weeks post procedure, please call and report this information to [place].    If any biopsies were taken you will be contacted by phone or by letter within the next 1-3 weeks.  Please call us at [phone] if you have not heard about the biopsies in 3 weeks.    SIGNATURES/CONFIDENTIALITY: You and/or your care partner have signed paperwork which will be entered into your electronic medical record.  These signatures attest to the fact that that the information above on your After Visit Summary has been reviewed and is understood.  Full responsibility of the confidentiality of this discharge information lies with you and/or your care-partner.

## 2020-12-10 ENCOUNTER — Telehealth: Payer: Self-pay | Admitting: *Deleted

## 2020-12-10 NOTE — Telephone Encounter (Signed)
  Follow up Call-  Call back number 12/08/2020  Post procedure Call Back phone  # [phone]  Permission to leave phone message Yes  Some recent data might be hidden     Patient questions:  Do you have a fever, pain , or abdominal swelling? No. Pain Score  0 *  Have you tolerated food without any problems? Yes.    Have you been able to return to your normal activities? Yes.    Do you have any questions about your discharge instructions: Diet   No. Medications  No. Follow up visit  No.  Do you have questions or concerns about your Care? No.  Actions: * If pain score is 4 or above: 1. No action needed, pain <4.Have you developed a fever since your procedure? no  2.   Have you had an respiratory symptoms (SOB or cough) since your procedure? no  3.   Have you tested positive for COVID 19 since your procedure no  4.   Have you had any family members/close contacts diagnosed with the COVID 19 since your procedure?  no   If yes to any of these questions please route to Joylene John, RN and Joella Prince, RN

## 2020-12-17 ENCOUNTER — Encounter: Payer: Self-pay | Admitting: Gastroenterology

## 2021-03-24 IMAGING — CR LEFT MIDDLE FINGER 2+V
3 series · 3 of 3 positions shown · non-contrast
Comparison: None.

CLINICAL DATA: Finger injury swelling and redness

EXAM:
LEFT MIDDLE FINGER 2+V

[finger ap]
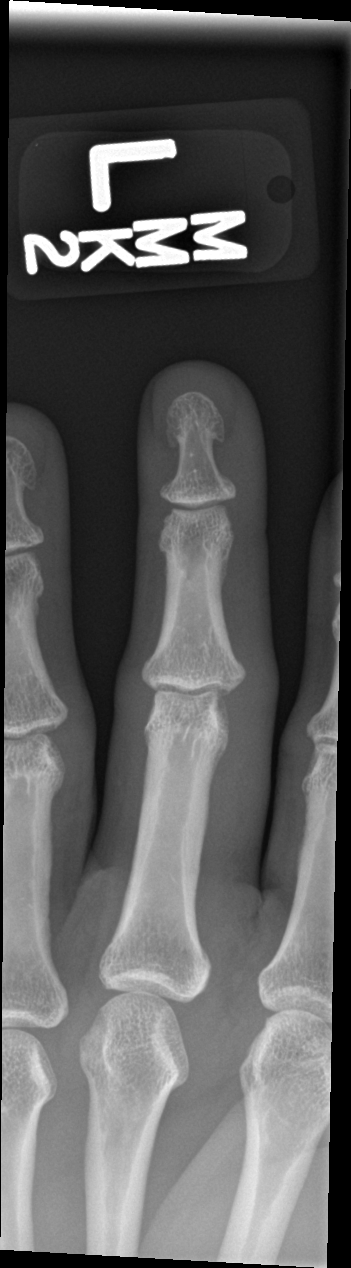

[finger obl]
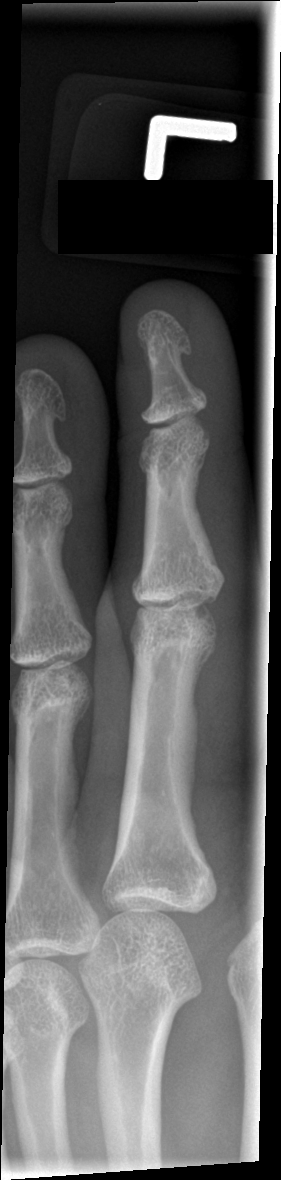

[finger lat]
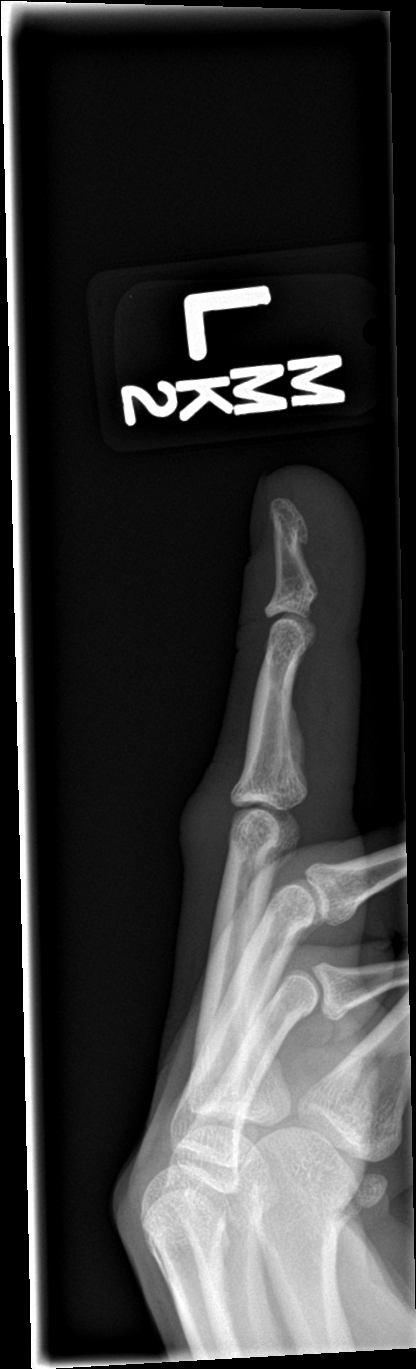

[3 of 3 positions shown; findings below may reference images not displayed]

FINDINGS: There is no evidence of fracture or dislocation. There is no
evidence of arthropathy or other focal bone abnormality. Soft tissue
swelling is present
IMPRESSION: No acute osseous abnormality
